# Patient Record
Sex: Male | Born: 1970 | Race: White | Hispanic: No | Smoking: Never smoker
Health system: Southern US, Community
[De-identification: ages and names within clinical notes are randomized; demographics above are authoritative.]

---

## 2008-12-11 ENCOUNTER — Emergency Department (HOSPITAL_BASED_OUTPATIENT_CLINIC_OR_DEPARTMENT_OTHER): Admission: EM | Admit: 2008-12-11 | Discharge: 2008-12-11 | Payer: Self-pay | Admitting: Emergency Medicine

## 2015-11-16 ENCOUNTER — Emergency Department (HOSPITAL_BASED_OUTPATIENT_CLINIC_OR_DEPARTMENT_OTHER)
Admission: EM | Admit: 2015-11-16 | Discharge: 2015-11-16 | Disposition: A | Discharge status: Home / Self Care | Payer: BLUE CROSS/BLUE SHIELD | Attending: Emergency Medicine | Admitting: Emergency Medicine

## 2015-11-16 ENCOUNTER — Encounter (HOSPITAL_BASED_OUTPATIENT_CLINIC_OR_DEPARTMENT_OTHER): Payer: Self-pay | Admitting: *Deleted

## 2015-11-16 ENCOUNTER — Emergency Department (HOSPITAL_BASED_OUTPATIENT_CLINIC_OR_DEPARTMENT_OTHER): Payer: BLUE CROSS/BLUE SHIELD

## 2015-11-16 DIAGNOSIS — M545 Low back pain: Secondary | ICD-10-CM | POA: Insufficient documentation

## 2015-11-16 MED ORDER — DEXAMETHASONE SODIUM PHOSPHATE 10 MG/ML IJ SOLN
10.0000 mg | Freq: Once | INTRAMUSCULAR | Status: AC
  Administered 2015-11-16: 10 mg via INTRAVENOUS
  Filled 2015-11-16: qty 1

## 2015-11-16 MED ORDER — NAPROXEN 500 MG PO TABS: 500.0000 mg | ORAL_TABLET | Freq: Two times a day (BID) | ORAL | Status: AC

## 2015-11-16 MED ORDER — ORPHENADRINE CITRATE ER 100 MG PO TB12: 100.0000 mg | ORAL_TABLET | Freq: Two times a day (BID) | ORAL | Status: AC | PRN

## 2015-11-16 NOTE — ED Notes (Signed)
Pt states pain started with an injury suddenly while shoveling dirt approx 2 months prior.

## 2015-11-16 NOTE — ED Notes (Signed)
Pt with right sided low back pain radiating down right leg x 3 months.

## 2015-11-16 NOTE — Discharge Instructions (Signed)
Medications: Naprosyn, Norflex  Treatment: Take Naprosyn twice daily for your pain for 1 week. Take Norflex twice daily as needed for your pain and muscle spasms. Do not drive or operate machinery when taking this medication. Use moist heat 3-4 times daily alternating 20 minutes on, 20 minutes off. Attempt the back exercises as indicated below as tolerated.  Follow-up: Please follow-up with Dr. Bevely Palmer, a neurosurgeon, for further evaluation and treatment of your back pain. Please follow-up with your primary care provider next week for follow-up of today's visit. Please return to emergency department if you develop any new or worsening symptoms such as loss of bowel or bladder control, numbness in your groin, or any other alarming symptoms.   Back Pain, Adult Back pain is very common in adults.The cause of back pain is rarely dangerous and the pain often gets better over time.The cause of your back pain may not be known. Some common causes of back pain include: 1. Strain of the muscles or ligaments supporting the spine. 2. Wear and tear (degeneration) of the spinal disks. 3. Arthritis. 4. Direct injury to the back. For many people, back pain may return. Since back pain is rarely dangerous, most people can learn to manage this condition on their own. HOME CARE INSTRUCTIONS Watch your back pain for any changes. The following actions may help to lessen any discomfort you are feeling: 1. Remain active. It is stressful on your back to sit or stand in one place for long periods of time. Do not sit, drive, or stand in one place for more than 30 minutes at a time. Take short walks on even surfaces as soon as you are able.Try to increase the length of time you walk each day. 2. Exercise regularly as directed by your health care provider. Exercise helps your back heal faster. It also helps avoid future injury by keeping your muscles strong and flexible. 3. Do not stay in bed.Resting more than 1-2 days can  delay your recovery. 4. Pay attention to your body when you bend and lift. The most comfortable positions are those that put less stress on your recovering back. Always use proper lifting techniques, including: 1. Bending your knees. 2. Keeping the load close to your body. 3. Avoiding twisting. 5. Find a comfortable position to sleep. Use a firm mattress and lie on your side with your knees slightly bent. If you lie on your back, put a pillow under your knees. 6. Avoid feeling anxious or stressed.Stress increases muscle tension and can worsen back pain.It is important to recognize when you are anxious or stressed and learn ways to manage it, such as with exercise. 7. Take medicines only as directed by your health care provider. Over-the-counter medicines to reduce pain and inflammation are often the most helpful.Your health care provider may prescribe muscle relaxant drugs.These medicines help dull your pain so you can more quickly return to your normal activities and healthy exercise. 8. Apply ice to the injured area: 1. Put ice in a plastic bag. 2. Place a towel between your skin and the bag. 3. Leave the ice on for 20 minutes, 2-3 times a day for the first 2-3 days. After that, ice and heat may be alternated to reduce pain and spasms. 9. Maintain a healthy weight. Excess weight puts extra stress on your back and makes it difficult to maintain good posture. SEEK MEDICAL CARE IF: 1. You have pain that is not relieved with rest or medicine. 2. You have increasing pain going  down into the legs or buttocks. 3. You have pain that does not improve in one week. 4. You have night pain. 5. You lose weight. 6. You have a fever or chills. SEEK IMMEDIATE MEDICAL CARE IF:  1. You develop new bowel or bladder control problems. 2. You have unusual weakness or numbness in your arms or legs. 3. You develop nausea or vomiting. 4. You develop abdominal pain. 5. You feel faint.   This information is not  intended to replace advice given to you by your health care provider. Make sure you discuss any questions you have with your health care provider.   Document Released: 04/14/2005 Document Revised: 05/05/2014 Document Reviewed: 08/16/2013 Elsevier Interactive Patient Education 2016 [employer]  Back Exercises The following exercises strengthen the muscles that help to support the back. They also help to keep the lower back flexible. Doing these exercises can help to prevent back pain or lessen existing pain. If you have back pain or discomfort, try doing these exercises 2-3 times each day or as told by your health care provider. When the pain goes away, do them once each day, but increase the number of times that you repeat the steps for each exercise (do more repetitions). If you do not have back pain or discomfort, do these exercises once each day or as told by your health care provider. EXERCISES Single Knee to Chest Repeat these steps 3-5 times for each leg: 5. Lie on your back on a firm bed or the floor with your legs extended. 6. Bring one knee to your chest. Your other leg should stay extended and in contact with the floor. 7. Hold your knee in place by grabbing your knee or thigh. 8. Pull on your knee until you feel a gentle stretch in your lower back. 9. Hold the stretch for 10-30 seconds. 10. Slowly release and straighten your leg. Pelvic Tilt Repeat these steps 5-10 times: 10. Lie on your back on a firm bed or the floor with your legs extended. 11. Bend your knees so they are pointing toward the ceiling and your feet are flat on the floor. 12. Tighten your lower abdominal muscles to press your lower back against the floor. This motion will tilt your pelvis so your tailbone points up toward the ceiling instead of pointing to your feet or the floor. 13. With gentle tension and even breathing, hold this position for 5-10 seconds. Cat-Cow Repeat these steps until your lower back  becomes more flexible: 7. Get into a hands-and-knees position on a firm surface. Keep your hands under your shoulders, and keep your knees under your hips. You may place padding under your knees for comfort. 8. Let your head hang down, and point your tailbone toward the floor so your lower back becomes rounded like the back of a cat. 9. Hold this position for 5 seconds. 10. Slowly lift your head and point your tailbone up toward the ceiling so your back forms a sagging arch like the back of a cow. 11. Hold this position for 5 seconds. Press-Ups Repeat these steps 5-10 times: 6. Lie on your abdomen (face-down) on the floor. 7. Place your palms near your head, about shoulder-width apart. 8. While you keep your back as relaxed as possible and keep your hips on the floor, slowly straighten your arms to raise the top half of your body and lift your shoulders. Do not use your back muscles to raise your upper torso. You may adjust the placement of  your hands to make yourself more comfortable. 9. Hold this position for 5 seconds while you keep your back relaxed. 10. Slowly return to lying flat on the floor. Bridges Repeat these steps 10 times: 1. Lie on your back on a firm surface. 2. Bend your knees so they are pointing toward the ceiling and your feet are flat on the floor. 3. Tighten your buttocks muscles and lift your buttocks off of the floor until your waist is at almost the same height as your knees. You should feel the muscles working in your buttocks and the back of your thighs. If you do not feel these muscles, slide your feet 1-2 inches farther away from your buttocks. 4. Hold this position for 3-5 seconds. 5. Slowly lower your hips to the starting position, and allow your buttocks muscles to relax completely. If this exercise is too easy, try doing it with your arms crossed over your chest. Abdominal Crunches Repeat these steps 5-10 times: 1. Lie on your back on a firm bed or the floor  with your legs extended. 2. Bend your knees so they are pointing toward the ceiling and your feet are flat on the floor. 3. Cross your arms over your chest. 4. Tip your chin slightly toward your chest without bending your neck. 5. Tighten your abdominal muscles and slowly raise your trunk (torso) high enough to lift your shoulder blades a tiny bit off of the floor. Avoid raising your torso higher than that, because it can put too much stress on your low back and it does not help to strengthen your abdominal muscles. 6. Slowly return to your starting position. Back Lifts Repeat these steps 5-10 times: 1. Lie on your abdomen (face-down) with your arms at your sides, and rest your forehead on the floor. 2. Tighten the muscles in your legs and your buttocks. 3. Slowly lift your chest off of the floor while you keep your hips pressed to the floor. Keep the back of your head in line with the curve in your back. Your eyes should be looking at the floor. 4. Hold this position for 3-5 seconds. 5. Slowly return to your starting position. SEEK MEDICAL CARE IF:  Your back pain or discomfort gets much worse when you do an exercise.  Your back pain or discomfort does not lessen within 2 hours after you exercise. If you have any of these problems, stop doing these exercises right away. Do not do them again unless your health care provider says that you can. SEEK IMMEDIATE MEDICAL CARE IF:  You develop sudden, severe back pain. If this happens, stop doing the exercises right away. Do not do them again unless your health care provider says that you can.   This information is not intended to replace advice given to you by your health care provider. Make sure you discuss any questions you have with your health care provider.   Document Released: 05/22/2004 Document Revised: 01/03/2015 Document Reviewed: 06/08/2014 Elsevier Interactive Patient Education [employer]

## 2015-11-16 NOTE — ED Provider Notes (Signed)
CSN: [id number]     Arrival date & time 11/16/15  1738 History  By signing my name below, I, Linna Darner, attest that this documentation has been prepared under the direction and in the presence of non-physician practitioner, Buel Ream, PA-C. Electronically Signed: Linna Darner, Scribe. 11/16/2015. 6:37 PM.   Chief Complaint  Patient presents with  . Back Pain    The history is provided by the patient. No language interpreter was used.     HPI Comments: Anthony Murillo is a 45 y.o. male who presents to the Emergency Department complaining of gradual onset, constant, severe, aching, stabbing, right lower back pain for the last couple of months. He notes that the pain intermittently shoots down his right lower extremity around at his knee and to his shin. Pt reports that he has a strenuous job which exacerbates his right lower back pain. He reports that his lower back pain is less severe with standing upright and worsened by sitting for long periods of time. He states he has used various OTC pain medications and has tried stretching, ice, and heat with little relief of his symptoms; he notes that ice worsened his pain. Patient reports that he was digging with a shovel several months ago and awkwardly extended his right leg; he believes this may have caused his lower back pain. Pt denies h/o cancer or IV drug use. He further denies left lower back pain, neck pain, fever, weight loss, CP, difficulty urinating, dysuria, SOB, neck pain, nausea, vomiting, abdominal pain, saddle anesthesia, bowel/bladder incontinence, numbness/tingling in his legs.  History reviewed. No pertinent past medical history. History reviewed. No pertinent past surgical history. History reviewed. No pertinent family history. Social History  Substance Use Topics  . Smoking status: Never Smoker   . Smokeless tobacco: Never Used  . Alcohol Use: No    Review of Systems  Constitutional: Negative for fever, chills and  unexpected weight change.  HENT: Negative for facial swelling.   Respiratory: Negative for shortness of breath.   Cardiovascular: Negative for chest pain.  Gastrointestinal: Negative for nausea, vomiting and abdominal pain.       Negative for bowel incontinence.  Genitourinary: Negative for dysuria and difficulty urinating.       Negative for bladder incontinence.  Musculoskeletal: Positive for myalgias (RLE) and back pain (right-sided lower). Negative for neck pain.  Skin: Negative for rash and wound.  Neurological: Negative for numbness.  Psychiatric/Behavioral: The patient is not nervous/anxious.     Allergies  Review of patient's allergies indicates not on file.  Home Medications   Prior to Admission medications   Medication Sig Start Date End Date Taking? Authorizing Provider  naproxen (NAPROSYN) 500 MG tablet Take 1 tablet (500 mg total) by mouth 2 (two) times daily. 11/16/15   Emi Holes, PA-C  orphenadrine (NORFLEX) 100 MG tablet Take 1 tablet (100 mg total) by mouth 2 (two) times daily as needed for muscle spasms. 11/16/15   Harutyun Monteverde M Rubie Ficco, PA-C   BP 159/105 mmHg  Pulse 76  Temp(Src) 98.8 F (37.1 C) (Oral)  Resp 16  Ht  (1.803 m)  Wt 96.163 kg  BMI 29.58 kg/m2  SpO2 97% Physical Exam  Constitutional: He appears well-developed and well-nourished. No distress.  HENT:  Head: Normocephalic and atraumatic.  Mouth/Throat: Oropharynx is clear and moist. No oropharyngeal exudate.  Eyes: Conjunctivae and EOM are normal. Pupils are equal, round, and reactive to light. Right eye exhibits no discharge. Left eye exhibits no discharge.  No scleral icterus.  Neck: Normal range of motion. Neck supple. No thyromegaly present.  Cardiovascular: Normal rate, regular rhythm, normal heart sounds and intact distal pulses.  Exam reveals no gallop and no friction rub.   No murmur heard. Pulmonary/Chest: Effort normal and breath sounds normal. No stridor. No respiratory distress.  He has no wheezes. He has no rales.  Abdominal: Soft. Bowel sounds are normal. He exhibits no distension. There is no tenderness. There is no rebound and no guarding.  Musculoskeletal: He exhibits no edema.       Lumbar back: He exhibits tenderness. He exhibits no bony tenderness.       Back:  Pt can heel raise and toe raise without difficulty. Tenderness to right anterior hip and greater trochanter.   Lymphadenopathy:    He has no cervical adenopathy.  Neurological: He is alert. Coordination normal.  Reflex Scores:      Patellar reflexes are 2+ on the right side and 2+ on the left side. CN 3-12 intact; normal sensation throughout; 5/5 strength in all 4 extremities; equal bilateral grip strength   Skin: Skin is warm and dry. No rash noted. He is not diaphoretic. No pallor.  Psychiatric: He has a normal mood and affect.  Nursing note and vitals reviewed.   ED Course  Procedures (including critical care time)  DIAGNOSTIC STUDIES: Oxygen Saturation is 98% on RA, normal by my interpretation.    COORDINATION OF CARE: 6:37 PM Discussed treatment plan with pt at bedside and pt agreed to plan.  Labs Review Labs Reviewed - No data to display  Imaging Review Dg Lumbar Spine Complete  11/16/2015  CLINICAL DATA:  Shoveling injury, acute right low back and hip pain. EXAM: LUMBAR SPINE - COMPLETE 4+ VIEW COMPARISON:  11/16/2015 FINDINGS: Straightened lumbar spine alignment. Severe degenerative spondylosis at L4-5 and L5-S1 with disc space narrowing, sclerosis and osteophytes. No compression fracture, wedge-shaped deformity or focal kyphosis. Facets are aligned. No definite pars defects. Pedicles appear intact. Normal SI joints and bowel gas pattern. IMPRESSION: Lumbar spondylosis most pronounced at L4-5 and L5-S1. No acute osseous finding by plain radiography. Electronically Signed   By: Judie Petit.  Shick M.D.   On: 11/16/2015 20:18   Dg Hip Unilat With Pelvis 2-3 Views Right  11/16/2015  CLINICAL  DATA:  Recent injury, acute pain in the right low back and gluteal region. EXAM: DG HIP (WITH OR WITHOUT PELVIS) 2-3V RIGHT COMPARISON:  11/16/2015 FINDINGS: Lumbar degenerative spondylosis at L4-5 and L5-S1. Bony pelvis and hips appear symmetric and intact. Right hip appears unremarkable. IMPRESSION: No acute osseous finding. Lumbar spondylosis. Electronically Signed   By: Judie Petit.  Shick M.D.   On: 11/16/2015 20:20   I have personally reviewed and evaluated these images and lab results as part of my medical decision-making.   EKG Interpretation None      MDM   Patient with back pain with likely radiculopathy to the L4 dermatome. Lumbar x-ray shows lumbar spondylosis most pronounced at L4-L5 and L5-S1; no acute osseous finding. R hip x-ray shows no acute osseous finding. No neurological deficits and normal neuro exam.  Patient is ambulatory.  No loss of bowel or bladder control.  No concern for cauda equina.  No fever, night sweats, weight loss, h/o cancer, IVDA, no recent procedure to back. No urinary symptoms suggestive of UTI.  Supportive care and return precaution discussed. Patient discharged with Naprosyn, Norflex. Follow-up to PCP and neurosurgery. Appears safe for discharge at this time. Patient vitals stable throughout  ED course and discharged in satisfactory condition.   Final diagnoses:  Right low back pain, with sciatica presence unspecified    I personally performed the services described in this documentation, which was scribed in my presence. The recorded information has been reviewed and is accurate.   [address] Lancaster StreetAlexandra M Meganne Rita, PA-C 11/17/15 0221   Rolan BuccoMelanie Belfi, MD 11/19/15 1534

## 2017-05-20 IMAGING — DX DG HIP (WITH OR WITHOUT PELVIS) 2-3V*R*
3 series · 3 of 3 positions shown · non-contrast
Comparison: 11/16/2015

CLINICAL DATA: Recent injury, acute pain in the right low back and
gluteal region.

EXAM:
DG HIP (WITH OR WITHOUT PELVIS) 2-3V RIGHT

[pelvis ap]
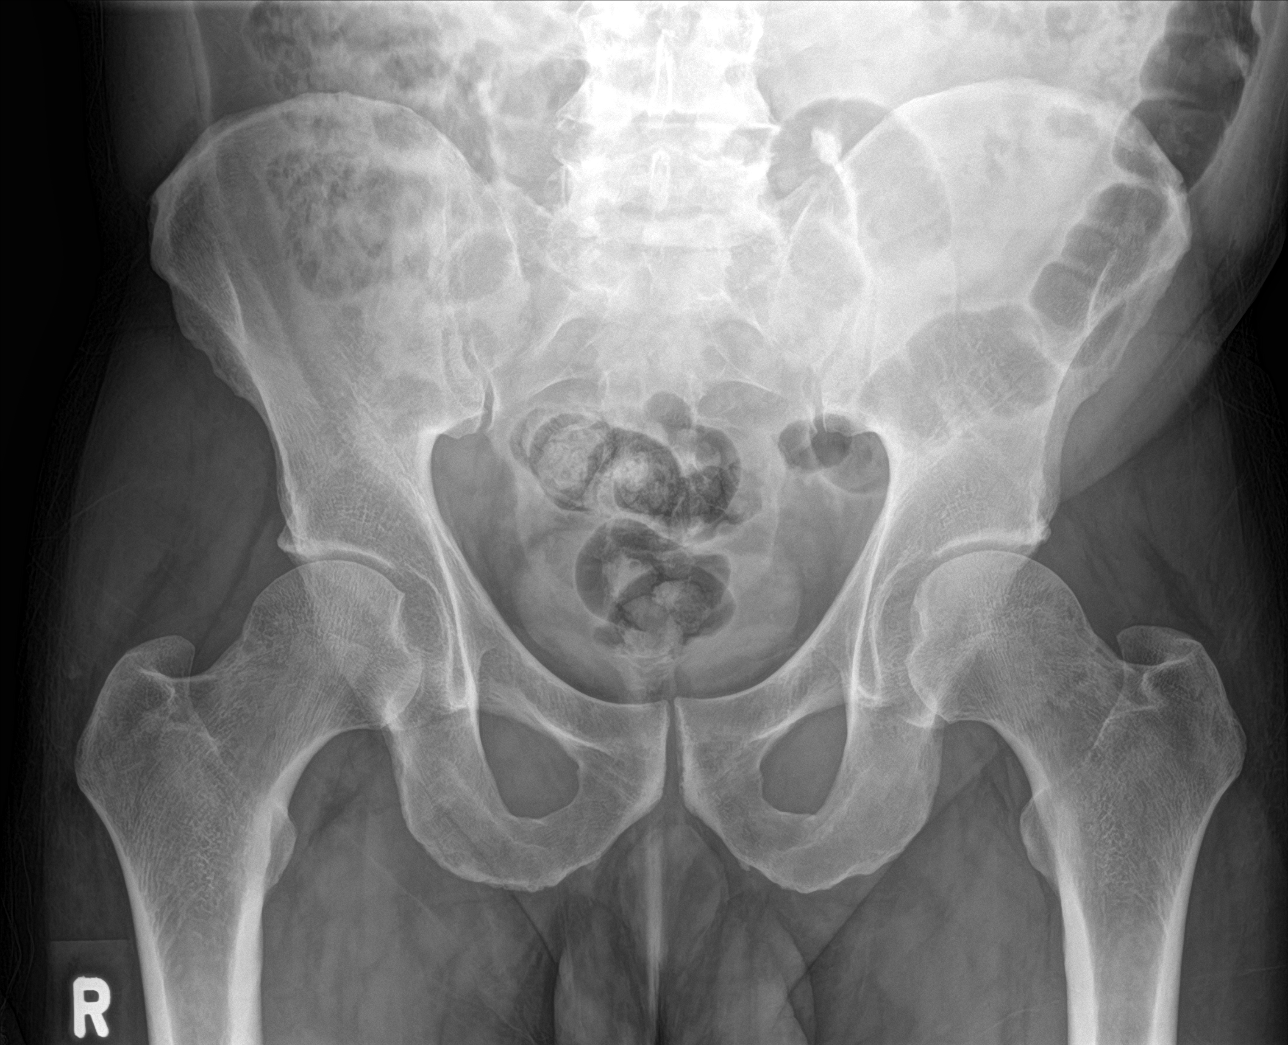

[hip ap]
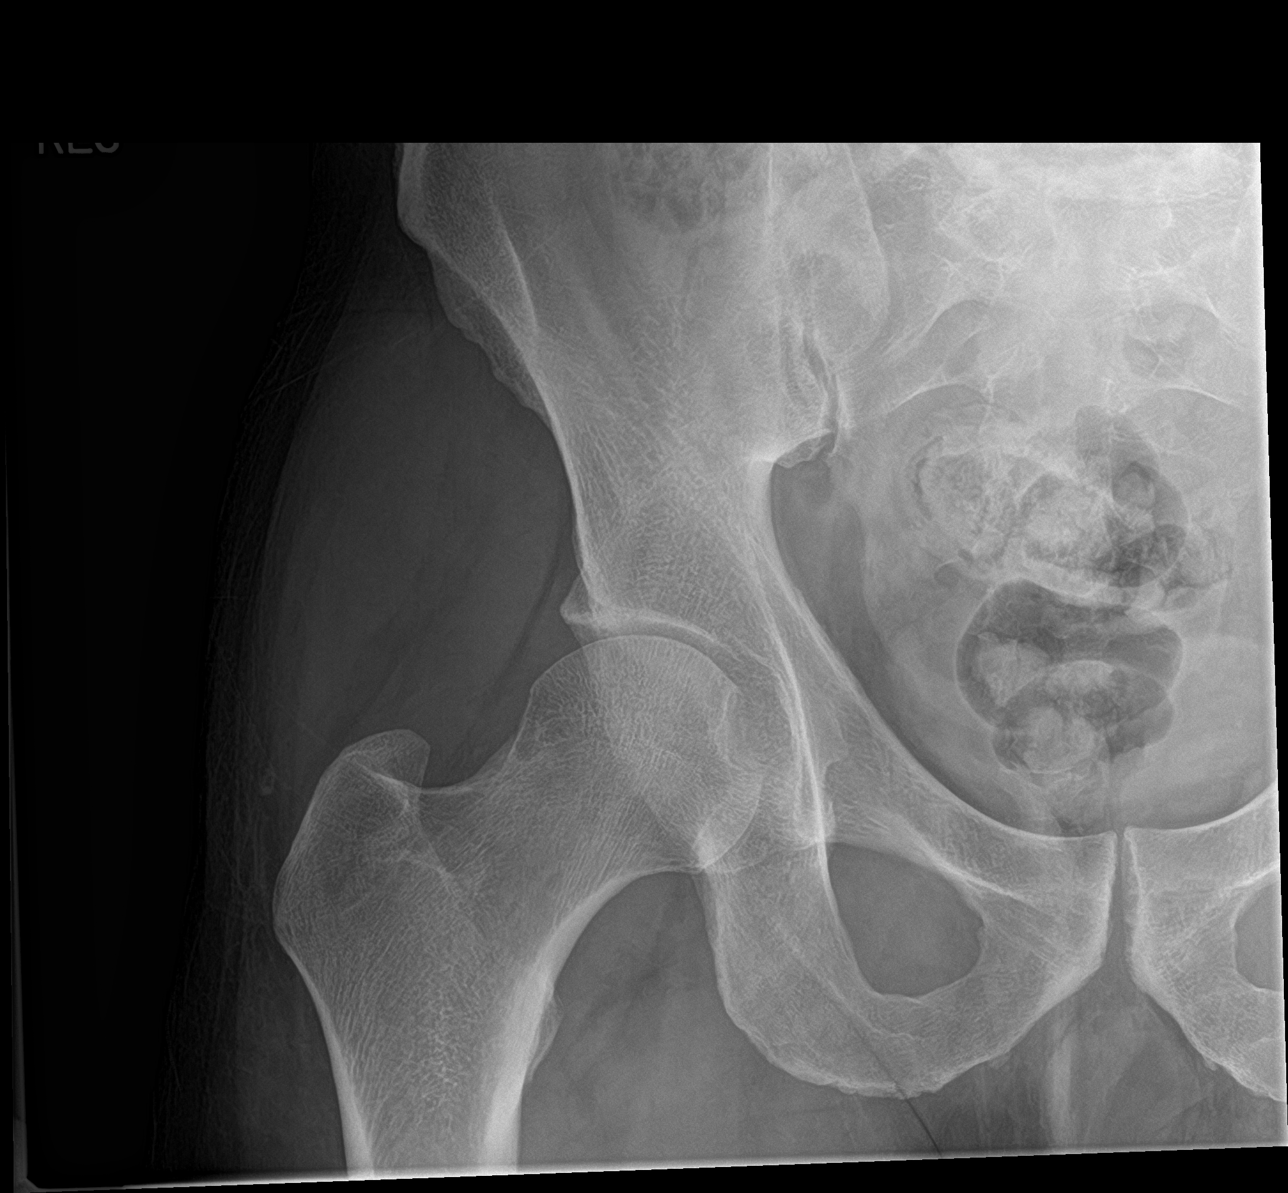

[hip lat]
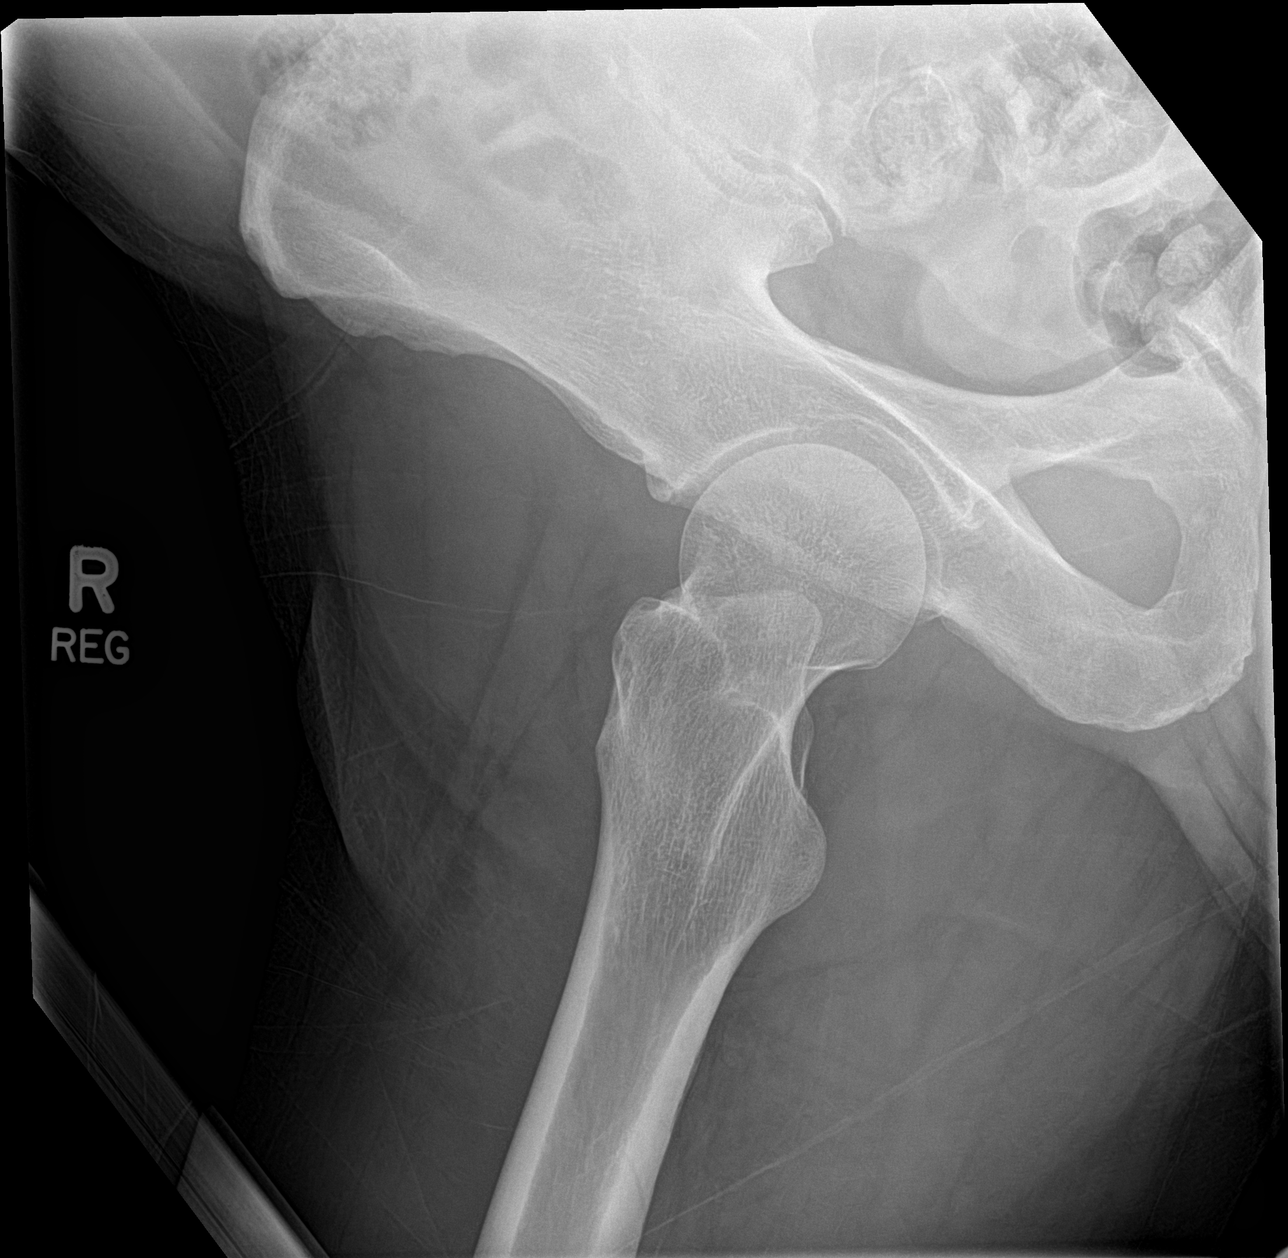

[3 of 3 positions shown; findings below may reference images not displayed]

FINDINGS: Lumbar degenerative spondylosis at L4-5 and L5-S1. Bony pelvis and
hips appear symmetric and intact. Right hip appears unremarkable.
IMPRESSION: No acute osseous finding.

Lumbar spondylosis.
